# Patient Record
Sex: Male | Born: 1990 | Race: White | Hispanic: No | Marital: Single
Health system: Midwestern US, Community
[De-identification: ages and names within clinical notes are randomized; demographics above are authoritative.]

---

## 2011-04-24 NOTE — ED Provider Notes (Signed)
I was personally available for consultation in the emergency department.  I have reviewed the chart and agree with the documentation recorded by the MLP, including the assessment, treatment plan, and disposition.  Iisha Soyars P Aubreigh Fuerte, MD
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
## 2011-04-24 NOTE — ED Notes (Signed)
Dakota Gilmore is a 21 y.o. male that was discharged in good condition.  The patients diagnosis, condition and treatment were explained to  patient and aftercare instructions were given.  The patient verbalized understanding. Patient armband removed and shredded.

## 2011-04-25 MED ORDER — OXYCODONE-ACETAMINOPHEN 5 MG-325 MG TAB
5-325 mg | ORAL_TABLET | ORAL | Status: DC
Start: 2011-04-25 — End: 2017-08-24

## 2011-04-25 MED ORDER — NAPROXEN 500 MG TAB
500 mg | ORAL_TABLET | Freq: Two times a day (BID) | ORAL | Status: AC
Start: 2011-04-25 — End: 2011-05-04

## 2017-08-24 ENCOUNTER — Inpatient Hospital Stay
Admit: 2017-08-24 | Discharge: 2017-08-24 | Disposition: A | Discharge status: Home / Self Care | Payer: BLUE CROSS/BLUE SHIELD | Attending: Emergency Medicine

## 2017-08-24 DIAGNOSIS — S90812A Abrasion, left foot, initial encounter: Secondary | ICD-10-CM

## 2017-08-24 MED ORDER — BACITRACIN 500 UNIT/G TOPICAL PACKET
500 unit/gram | CUTANEOUS | Status: AC
Start: 2017-08-24 — End: 2017-08-24
  Administered 2017-08-24: 17:00:00 via TOPICAL

## 2017-08-24 MED ORDER — BACITRACIN 500 UNIT/G OINTMENT
500 unit/gram | Freq: Three times a day (TID) | CUTANEOUS | 0 refills | Status: AC
Start: 2017-08-24 — End: 2017-09-03

## 2017-08-24 MED FILL — BACITRACIN 500 UNIT/G TOPICAL PACKET: 500 unit/gram | CUTANEOUS | Qty: 1
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
## 2017-08-24 NOTE — ED Notes (Signed)
I have reviewed discharge instructions with the patient.  The patient verbalized understanding.  Pt in no distress at time of discharge and agreeable to terms of discharge.
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
## 2020-03-14 IMAGING — MR MRI KNEE LT WO CONTRAST
5 series · 40 of 40 positions shown · IV contrast (gadolinium)
Comparison: Left knee radiographs 02/21/2020

HISTORY: Twisted left knee, pain
TECHNIQUE: Multiplanar and multisequence MR imaging of the left knee was performed without the administration of intravenous gadolinium.

[Series 2: t2_axial_fs · axial · 4.0mm · 0.55mm/px · z∈[-73,+61]mm · 8 of 28 slices shown]
[im 1/28]
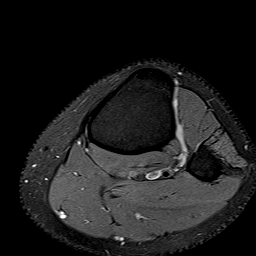
[im 4/28]
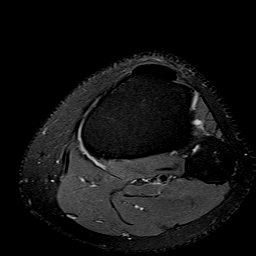
[im 8/28]
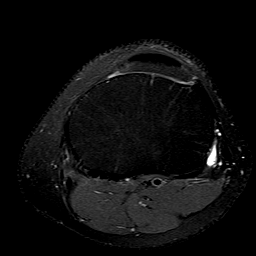
[im 12/28]
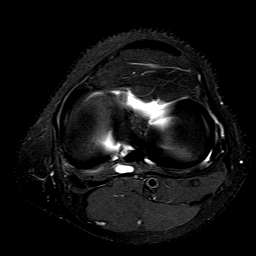
[im 16/28]
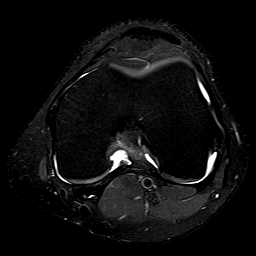
[im 20/28]
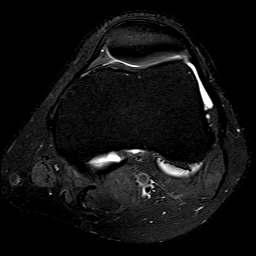
[im 24/28]
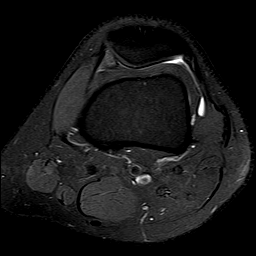
[im 28/28]
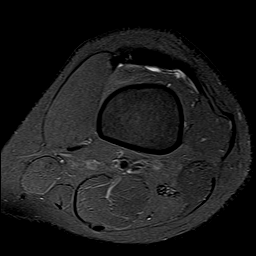

[Series 3: pd_sag fs · sagittal · 3.0mm · 0.55mm/px · 9 of 30 slices shown]
[im 1/30]
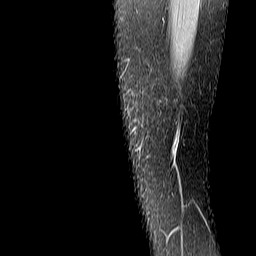
[im 4/30]
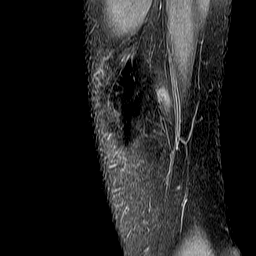
[im 8/30]
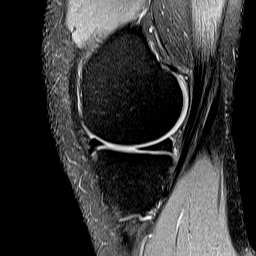
[im 11/30]
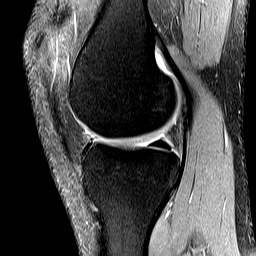
[im 15/30]
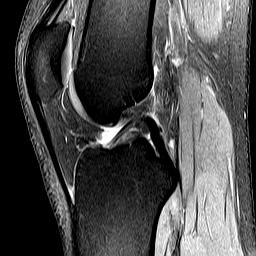
[im 19/30]
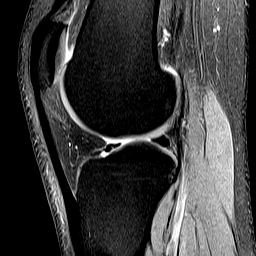
[im 22/30]
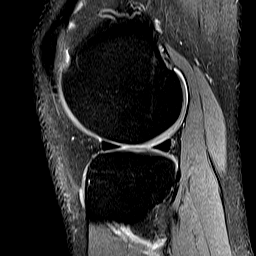
[im 26/30]
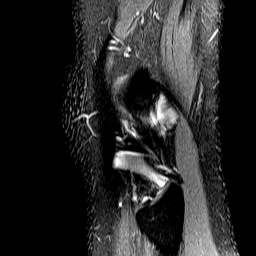
[im 30/30]
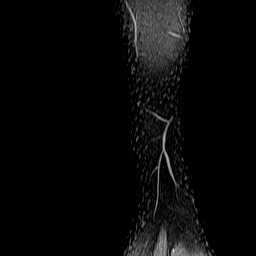

[Series 4: t2_sag_fs · sagittal · 3.0mm · 0.55mm/px · 9 of 30 slices shown]
[im 1/30]
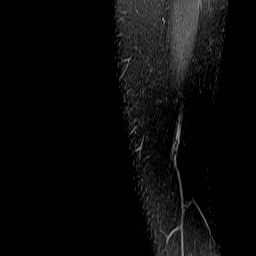
[im 4/30]
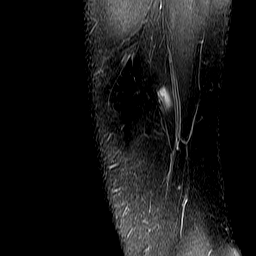
[im 8/30]
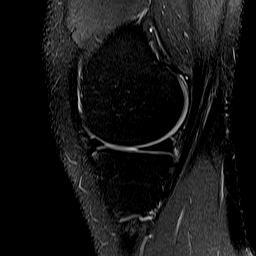
[im 11/30]
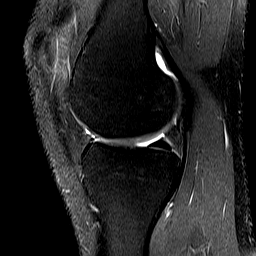
[im 15/30]
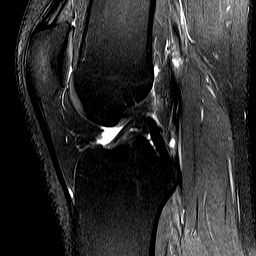
[im 19/30]
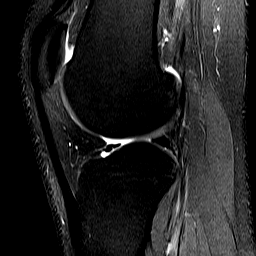
[im 22/30]
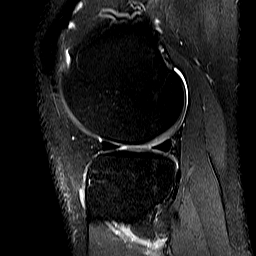
[im 26/30]
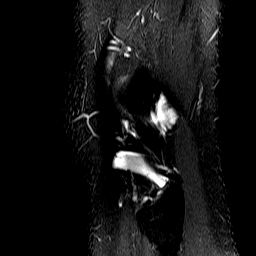
[im 30/30]
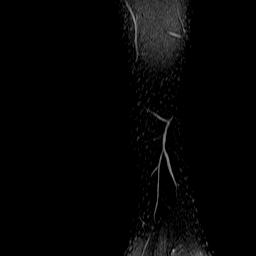

[Series 5: t1_cor · coronal · 4.0mm · 0.44mm/px · 7 of 24 slices shown]
[im 1/24]
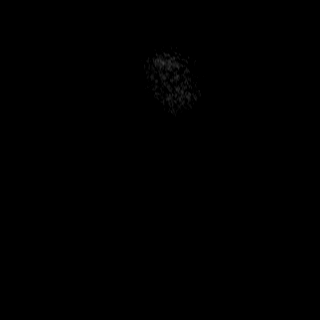
[im 4/24]
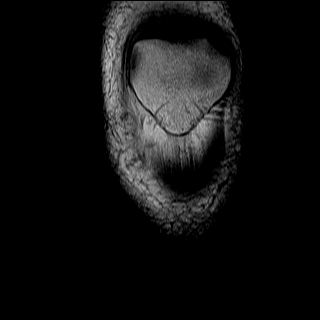
[im 8/24]
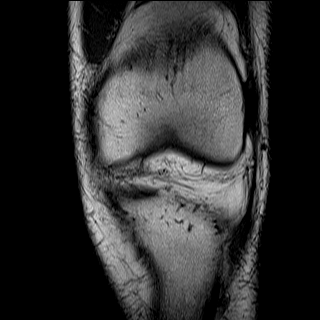
[im 12/24]
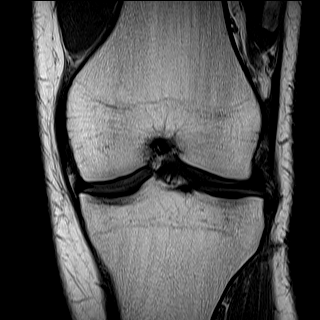
[im 16/24]
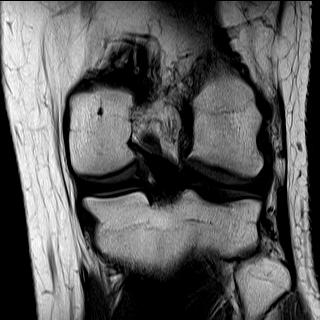
[im 20/24]
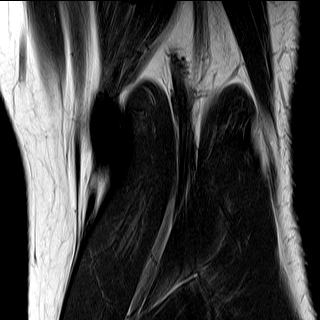
[im 24/24]
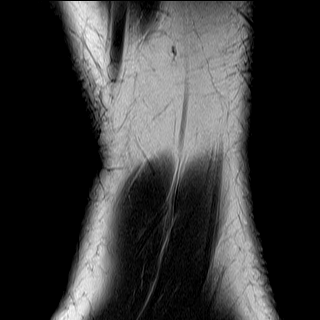

[Series 6: t2_cor_fs · coronal · 4.0mm · 0.55mm/px · 7 of 24 slices shown]
[im 1/24]
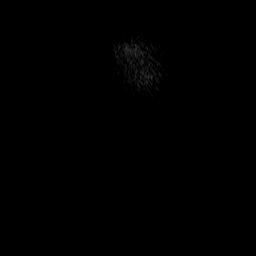
[im 4/24]
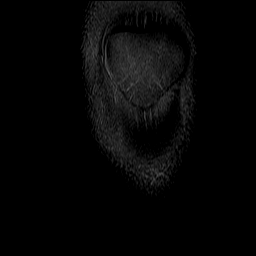
[im 8/24]
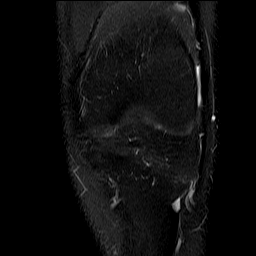
[im 12/24]
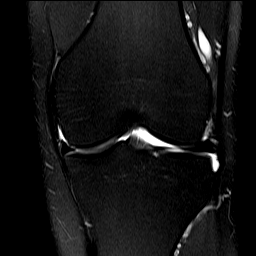
[im 16/24]
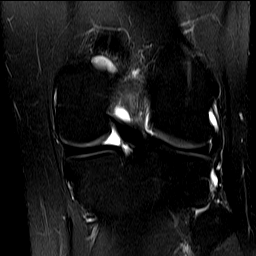
[im 20/24]
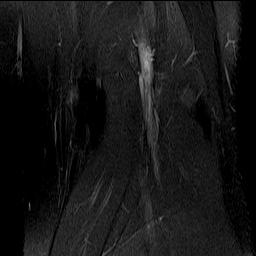
[im 24/24]
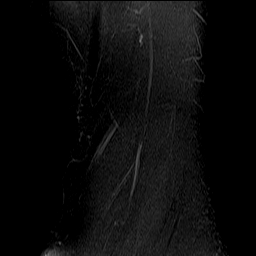

[40 of 40 positions shown; findings below may reference images not displayed]

FINDINGS: The anterior cruciate, posterior cruciate, medial collateral and lateral collateral ligaments are intact.

Medial meniscus is intact.

Small oblique undersurface tear anterior horn lateral meniscus.

Small effusion.

Surfaces are normally maintained without significant degeneration.

Osseous structures demonstrate no fractures or destructive lesions.

Extensor mechanism intact. Patellar retinacula unremarkable. Signal from muscle normal. Muscle mass normal. No cystic or solid lesions. No visible adenopathy.

Intra-articular fat pads are normal.
IMPRESSION: 1. Small oblique undersurface tear anterior horn lateral meniscus.

2. Small effusion.

3. Intact cruciate and collateral ligaments. Intact medial meniscus.

## 2020-05-08 IMAGING — MR MRI KNEE LT WO CONTRAST
5 series · 40 of 40 positions shown · non-contrast
Comparison: MRI left knee, 03/14/2020

INDICATION: Pain in the left knee
TECHNIQUE: Multiplanar, multisequence imaging of the left knee was performed without contrast.

[Series 4: pd_sag_fs · sagittal · 3.0mm · 0.57mm/px · 8 of 29 slices shown]
[im 1/29]
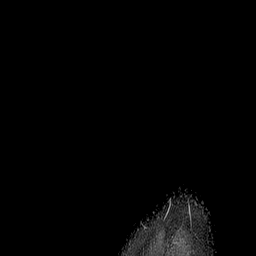
[im 5/29]
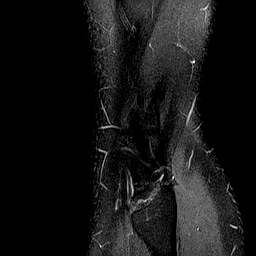
[im 9/29]
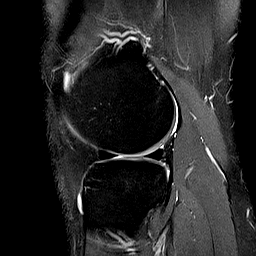
[im 13/29]
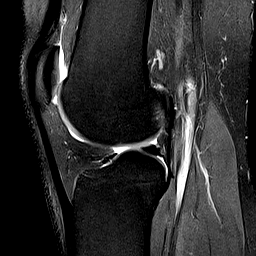
[im 17/29]
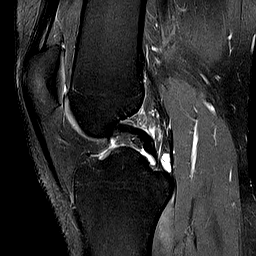
[im 21/29]
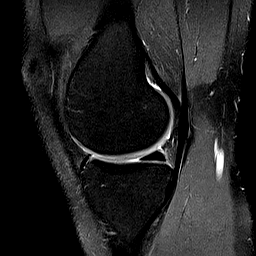
[im 25/29]
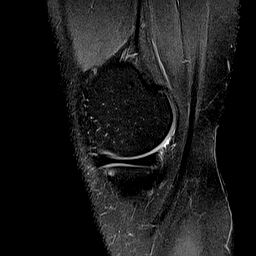
[im 29/29]
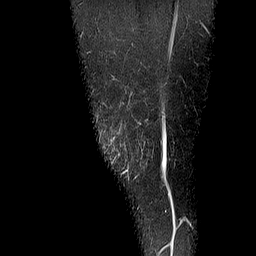

[Series 5: t2_sag_fs · sagittal · 3.0mm · 0.57mm/px · 9 of 29 slices shown]
[im 1/29]
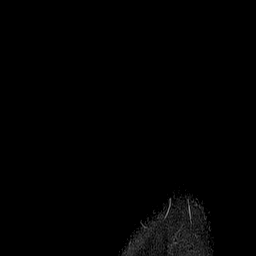
[im 4/29]
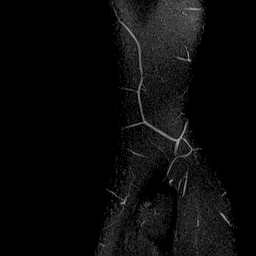
[im 8/29]
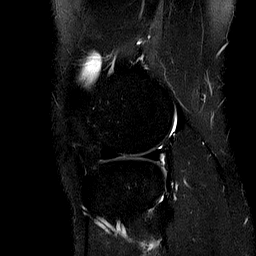
[im 11/29]
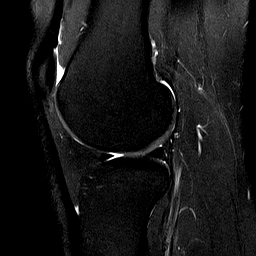
[im 15/29]
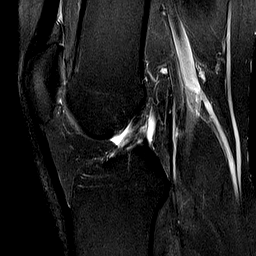
[im 18/29]
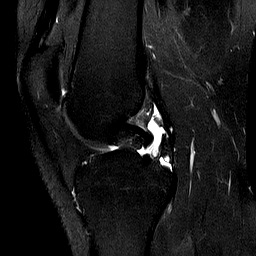
[im 22/29]
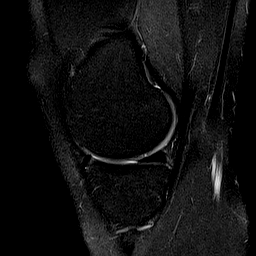
[im 25/29]
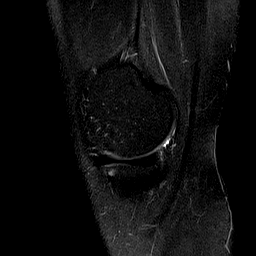
[im 29/29]
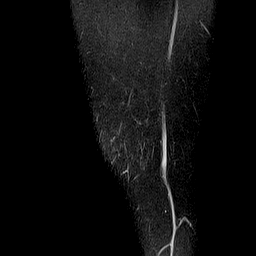

[Series 6: t1_cor · coronal · 4.0mm · 0.45mm/px · 7 of 24 slices shown]
[im 1/24]
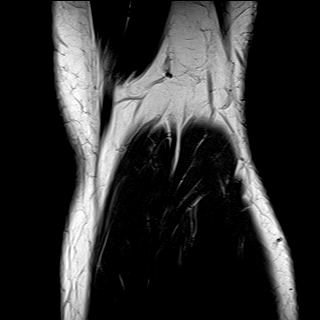
[im 4/24]
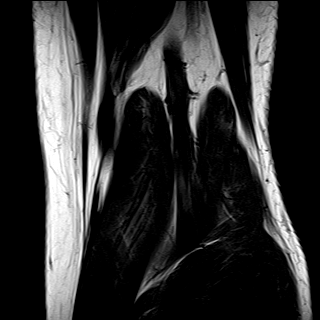
[im 8/24]
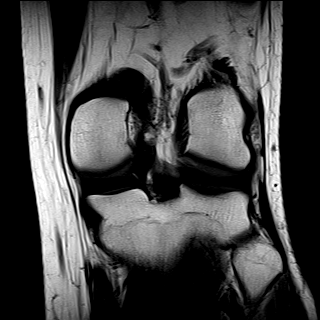
[im 12/24]
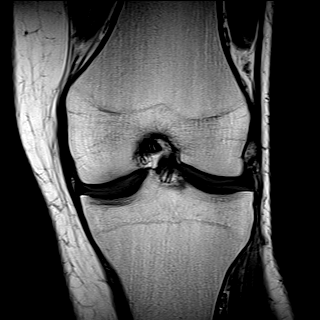
[im 16/24]
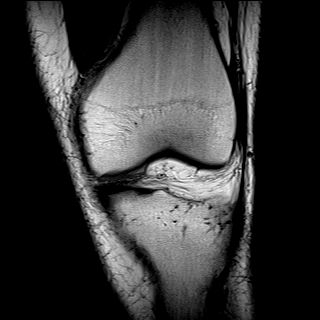
[im 20/24]
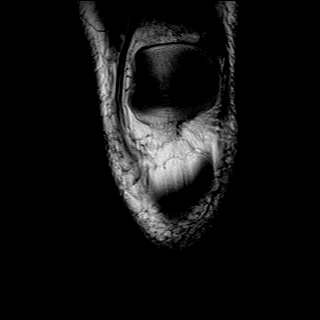
[im 24/24]
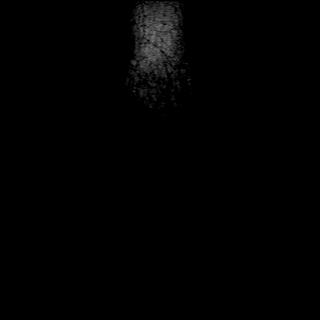

[Series 7: t2_cor_fs · coronal · 4.0mm · 0.45mm/px · 7 of 24 slices shown]
[im 1/24]
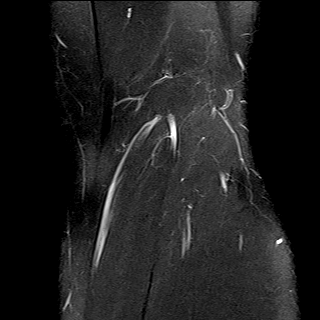
[im 4/24]
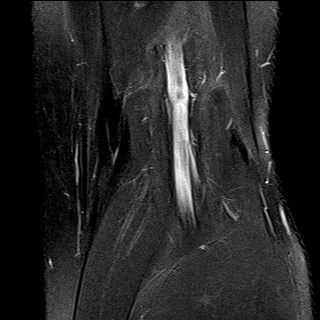
[im 8/24]
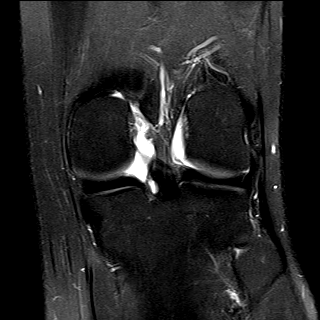
[im 12/24]
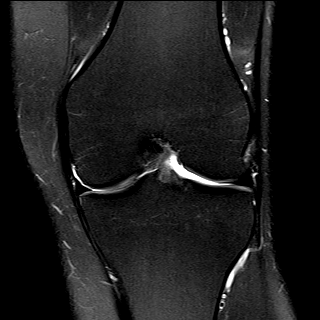
[im 16/24]
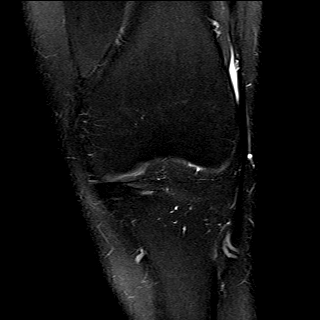
[im 20/24]
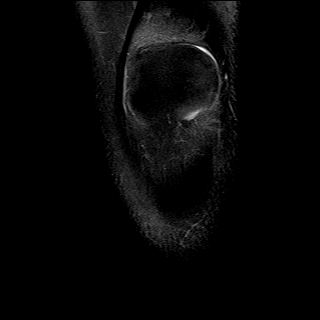
[im 24/24]
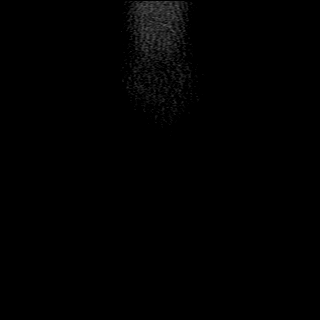

[Series 8: t2_axial_fs · axial · 4.0mm · 0.57mm/px · z∈[-67,+78]mm · 9 of 30 slices shown]
[im 1/30]
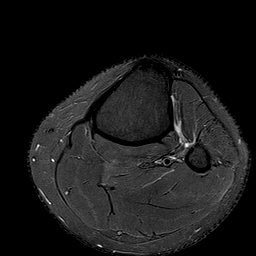
[im 4/30]
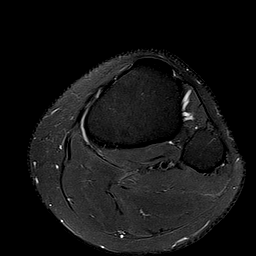
[im 8/30]
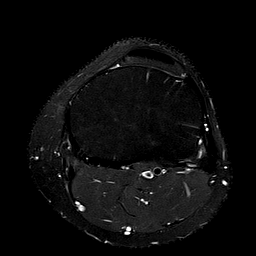
[im 11/30]
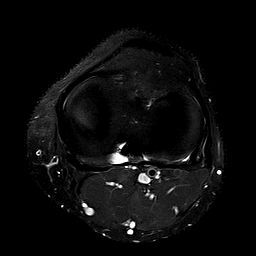
[im 15/30]
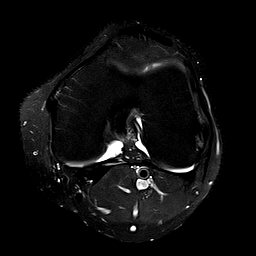
[im 19/30]
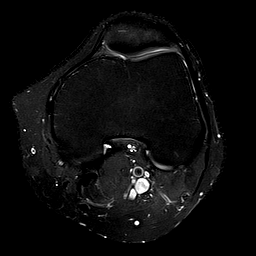
[im 22/30]
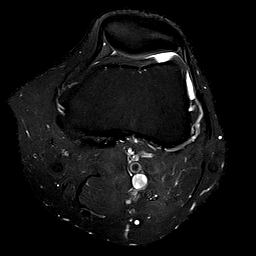
[im 26/30]
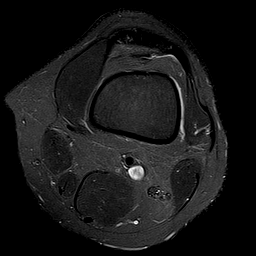
[im 30/30]
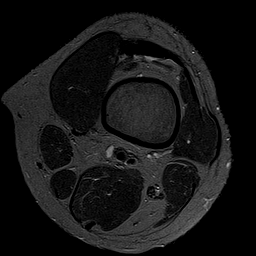

[40 of 40 positions shown; findings below may reference images not displayed]

FINDINGS: OSSEOUS: No acute fracture, avascular necrosis or aggressive osseous lesion.

MEDIAL JOINT COMPARTMENT:

Medial meniscus: Intact

Articular cartilage: Mild superficial partial-thickness chondral fibrillation in the central weightbearing medial femoral condyle. No underlying reactive osseous change.

LATERAL JOINT COMPARTMENT:

Lateral meniscus: Minimal intermediate signal of the T1-weighted images and anterior horn of the lateral meniscus (series 4, image 11) which does not contact the inferior articular surface.

Articular cartilage: Intact.

PATELLOFEMORAL JOINT AND EXTENSOR MECHANISM:

Quadriceps tendon: The visualized portion of the distal quadriceps tendon is intact.

Patella tendon: Intact.

Articular cartilage: Intact.

Alignment: The alignment of the patellofemoral joint is within normal limits, in the imaged position.

LIGAMENTS:

Anterior cruciate ligament: Intact.

Posterior cruciate ligament: Intact.

Medial collateral ligament: Intact.

Lateral collateral ligament: Intact.

MUSCULOTENDINOUS: The visualized musculotendinous soft tissues about the knee are unremarkable.

OTHER: Trace effusion.
IMPRESSION: 1.
Mild intrasubstance degeneration of the anterior horn of the lateral meniscus, without articular surface communicating tear.

2.
Minimal medial compartment chondrosis.
